# Patient Record
Sex: Male | Born: 2000 | Race: White | Hispanic: No | Marital: Single
Health system: Southern US, Community
[De-identification: ages and names within clinical notes are randomized; demographics above are authoritative.]

## PROBLEM LIST (undated history)

## (undated) DIAGNOSIS — Z22322 Carrier or suspected carrier of Methicillin resistant Staphylococcus aureus: Secondary | ICD-10-CM

---
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
## 2021-06-29 ENCOUNTER — Encounter (HOSPITAL_COMMUNITY): Payer: Self-pay | Admitting: Emergency Medicine

## 2021-06-29 ENCOUNTER — Other Ambulatory Visit: Payer: Self-pay

## 2021-06-29 ENCOUNTER — Emergency Department (HOSPITAL_COMMUNITY): Payer: No Typology Code available for payment source

## 2021-06-29 ENCOUNTER — Emergency Department (HOSPITAL_COMMUNITY)
Admission: EM | Admit: 2021-06-29 | Discharge: 2021-06-29 | Disposition: A | Discharge status: Home / Self Care | Payer: No Typology Code available for payment source | Attending: Emergency Medicine | Admitting: Emergency Medicine

## 2021-06-29 DIAGNOSIS — S0181XA Laceration without foreign body of other part of head, initial encounter: Secondary | ICD-10-CM | POA: Insufficient documentation

## 2021-06-29 DIAGNOSIS — S0990XA Unspecified injury of head, initial encounter: Secondary | ICD-10-CM | POA: Diagnosis present

## 2021-06-29 DIAGNOSIS — Y990 Civilian activity done for income or pay: Secondary | ICD-10-CM | POA: Diagnosis not present

## 2021-06-29 DIAGNOSIS — Y9301 Activity, walking, marching and hiking: Secondary | ICD-10-CM | POA: Diagnosis not present

## 2021-06-29 DIAGNOSIS — S060X1A Concussion with loss of consciousness of 30 minutes or less, initial encounter: Secondary | ICD-10-CM | POA: Diagnosis not present

## 2021-06-29 DIAGNOSIS — W08XXXA Fall from other furniture, initial encounter: Secondary | ICD-10-CM | POA: Insufficient documentation

## 2021-06-29 MED ORDER — LIDOCAINE-EPINEPHRINE (PF) 2 %-1:200000 IJ SOLN
10.0000 mL | Freq: Once | INTRAMUSCULAR | Status: AC
  Administered 2021-06-29: 10 mL
  Filled 2021-06-29: qty 20

## 2021-06-29 NOTE — ED Provider Notes (Signed)
1:13 PM Patient seen in conjunction with [place] PA-C. Patient here after a mechanical fall -- struck head and sustained a laceration to his chin and lower lip.  Reported loss of consciousness.  CT reviewed and is negative.  Patient back at baseline.  He has a gaping wound on his chin that is going to need repaired.  He has braces and has appropriate dental care to get his broken tooth addressed.  Up-to-date on tetanus. No neck pain or focal neuro deficits.   BP 116/67 (BP Location: Right Arm)    Pulse (!) 57    Temp 97.7 F (36.5 C) (Oral)    Resp 16    SpO2 100%   Plan to d/c with follow-up after wound cleaning and repair. Family at bedside and involved.    Renne Crigler, PA-C 06/29/21 1315    Melene Plan, DO 06/29/21 1318

## 2021-06-29 NOTE — ED Notes (Signed)
Patient transported to CT 

## 2021-06-29 NOTE — Discharge Instructions (Addendum)
You were seen today for a concussion and chin laceration.  I have provided patient instructions for concussion. I recommend getting the sutures removed in 5 days. Follow up if you experience loss of consciousness or altered mental status.

## 2021-06-29 NOTE — ED Triage Notes (Signed)
Pt states he tripped over a stool at work and fell around 11:30am.  Laceration to chin and broke L upper tooth.  Co-workers reported +LOC for a few seconds.  Unknown DT.

## 2021-06-29 NOTE — ED Provider Notes (Signed)
[facility] EMERGENCY DEPARTMENT Provider Note   CSN: [id number] Arrival date & time: 06/29/21  1158     History  Chief Complaint  Patient presents with   Lytle Michaels    Gregory Mcknight is a 21 y.o. male.  The patient presents to the emergency department today complaining of loss of consciousness, chin laceration, and broken tooth after a mechanical fall at work.  The patient was walking and tripped over a stool, and believes he fell hitting his face on the floor.  He does not remember hitting the floor but does remember coworkers waking him up.  The patient takes no medications and has no pertinent medical history.  HPI     Home Medications Prior to Admission medications   Medication Sig Start Date End Date Taking? Authorizing Provider  ibuprofen (ADVIL,MOTRIN) 200 MG tablet Take 400 mg by mouth every 8 (eight) hours as needed for headache.    [provider]  ondansetron (ZOFRAN) 4 MG tablet Take 1 tablet (4 mg total) by mouth every 8 (eight) hours as needed for nausea. 04/25/13   Alycia Rossetti, MD  polyethylene glycol powder [facility]) powder Take 8 capfuls of Miralax by mouth with 32-64 ounces of water or gatorade once for constipation clean out.   Following constipation clean out, you may take 1 capful of Miralax by mouth with 8-16 ounces of water or gatorade daily as needed to prevent future episodes of constipation. 10/07/16   Jean Rosenthal, NP  prednisoLONE (PRELONE) 15 MG/5ML SOLN Take 40mg  for 2 days, then 30mg  for 2 days, then 20mg  for 2 days, then 10mg  for 2 days, then 5mg  for 3 days. 10/15/14   Vivi Barrack, MD  white petrolatum (VASELINE) GEL Apply to anal fissure 3-5 times daily for 1 week. Afterwards, you may use the vaseline 3-5 times daily as needed. 10/07/16   Jean Rosenthal, NP      Allergies    Patient has no known allergies.    Review of Systems   Review of Systems  HENT:  Positive for dental problem.   Eyes:  Negative for  photophobia and visual disturbance.  Respiratory:  Negative for shortness of breath.   Cardiovascular:  Negative for chest pain.  Gastrointestinal:  Negative for abdominal pain.  Musculoskeletal:  Negative for neck pain.  Skin:  Positive for wound (Chin laceration).   Physical Exam Updated Vital Signs BP 116/67 (BP Location: Right Arm)    Pulse (!) 57    Temp 97.7 F (36.5 C) (Oral)    Resp 16    SpO2 100%  Physical Exam HENT:     Head: Normocephalic.     Mouth/Throat:   Eyes:     Extraocular Movements: Extraocular movements intact.     Pupils: Pupils are equal, round, and reactive to light.  Cardiovascular:     Rate and Rhythm: Normal rate and regular rhythm.     Pulses: Normal pulses.     Heart sounds: Normal heart sounds.  Pulmonary:     Effort: Pulmonary effort is normal.     Breath sounds: Normal breath sounds.  Musculoskeletal:     Cervical back: Normal range of motion.  Skin:    General: Skin is warm and dry.  Neurological:     General: No focal deficit present.     Mental Status: He is alert.  Psychiatric:        Mood and Affect: Mood normal.    ED Results / Procedures /  Treatments   Labs (all labs ordered are listed, but only abnormal results are displayed) Labs Reviewed - No data to display  EKG None  Radiology CT Head Wo Contrast  Result Date: 06/29/2021 CLINICAL DATA:  Provided history is head trauma and abnormal mental status. EXAM: CT HEAD WITHOUT CONTRAST TECHNIQUE: Contiguous axial images were obtained from the base of the skull through the vertex without intravenous contrast. RADIATION DOSE REDUCTION: This exam was performed according to the departmental dose-optimization program which includes automated exposure control, adjustment of the mA and/or kV according to patient size and/or use of iterative reconstruction technique. COMPARISON:  None. FINDINGS: Brain: No evidence of acute infarction, hemorrhage, hydrocephalus, extra-axial collection or mass  lesion/mass effect. Vascular: No hyperdense vessel or unexpected calcification. Skull: Normal. Negative for fracture or focal lesion. Sinuses/Orbits: Normal globes and orbits. Visualized sinuses are clear. Other: None. IMPRESSION: Normal enhanced CT scan of the brain. Electronically Signed   By: Lajean Manes M.D.   On: 06/29/2021 12:48    Procedures .Marland KitchenLaceration Repair  Date/Time: 06/29/2021 1:49 PM Performed by: Dorothyann Peng, PA Authorized by: Dorothyann Peng, PA   Consent:    Consent obtained:  Verbal   Consent given by:  Patient   Risks, benefits, and alternatives were discussed: yes     Risks discussed:  Pain, infection, retained foreign body and poor cosmetic result   Alternatives discussed:  No treatment Universal protocol:    Procedure explained and questions answered to patient or proxy's satisfaction: yes     Immediately prior to procedure, a time out was called: yes     Patient identity confirmed:  Verbally with patient Anesthesia:    Anesthesia method:  Local infiltration   Local anesthetic:  Lidocaine 2% WITH epi Laceration details:    Location:  Face   Face location:  Chin   Length (cm):  2.5   Depth (mm):  2 Pre-procedure details:    Preparation:  Patient was prepped and draped in usual sterile fashion Exploration:    Hemostasis achieved with:  Direct pressure   Imaging outcome: foreign body not noted     Wound exploration: wound explored through full range of motion     Contaminated: no   Treatment:    Area cleansed with:  Povidone-iodine   Amount of cleaning:  Standard   Irrigation solution:  Sterile saline   Irrigation method:  Syringe   Visualized foreign bodies/material removed: yes     Debridement:  Minimal   Undermining:  None Skin repair:    Repair method:  Sutures   Suture size:  5-0   Suture material:  Prolene   Suture technique:  Simple interrupted Approximation:    Approximation:  Close Repair type:    Repair type:   Simple Post-procedure details:    Dressing:  Open (no dressing)   Procedure completion:  Tolerated    Medications Ordered in ED Medications  lidocaine-EPINEPHrine (XYLOCAINE W/EPI) 2 %-1:200000 (PF) injection 10 mL (has no administration in time range)    ED Course/ Medical Decision Making/ A&P                           Medical Decision Making Amount and/or Complexity of Data Reviewed Radiology: ordered.  Risk Prescription drug management.   This patient presents to the ED for concern of loss of consciousness after a fall , this involves an extensive number of treatment options, and is a complaint that carries with  it a high risk of complications and morbidity.  The differential diagnosis includes but is not exclusive to concussion, carotid artery dissection, skull fractures and more.  The patient also had a dental injury and a chin laceration   Co morbidities that complicate the patient evaluation  None   Additional history obtained:  Additional history obtained from mother   Lab Tests:  None today   Imaging Studies ordered:  I ordered imaging studies including CT head without contrast  I independently visualized and interpreted imaging which showed no acute injury I agree with the radiologist interpretation    Medicines ordered and prescription drug management:  None   Test Considered:  CMP   Critical Interventions:  None   Consultations Obtained:  None    Reevaluation:  After the interventions noted above, I reevaluated the patient and found that they have :improved    Dispostion:  After consideration of the diagnostic results and the patients response to treatment, I feel that the patent would benefit from discharge home with outpatient follow up. The patient's mother is scheduling appointments to address the dental injury. The patient's CT scan was negative for acute processes. His symptoms are consistent with concussion. Information  provided for concussion clinic. Laceration repair completed. Return precautions given     Final Clinical Impression(s) / ED Diagnoses Final diagnoses:  Concussion with loss of consciousness of 30 minutes or less, initial encounter  Laceration of skin of chin, initial encounter    Rx / DC Orders ED Discharge Orders     None         Dorothyann Peng, [place] 06/29/21 [place], DO 06/29/21 1421

## 2022-06-01 IMAGING — CT CT HEAD W/O CM
4 series · 16 of 47 positions shown, 18 images · non-contrast
Comparison: None.

CLINICAL DATA: Provided history is head trauma and abnormal mental
status.



[Series 3: head without · axial · non-contrast · 0.43mm/px · z∈[+26,+151]mm · 7 of 35 slices shown, 9 images]
[im 5/35  brain]
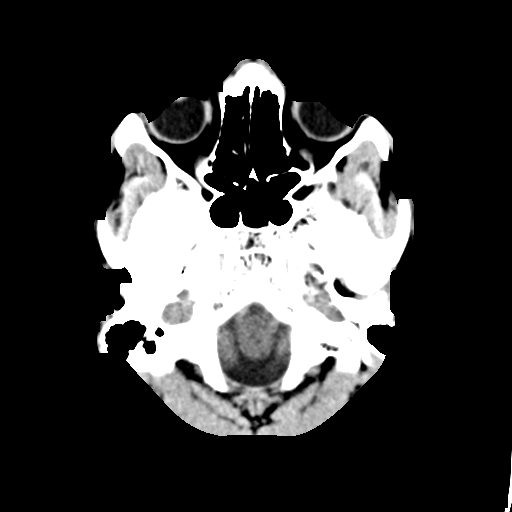
[im 5/35  bone]
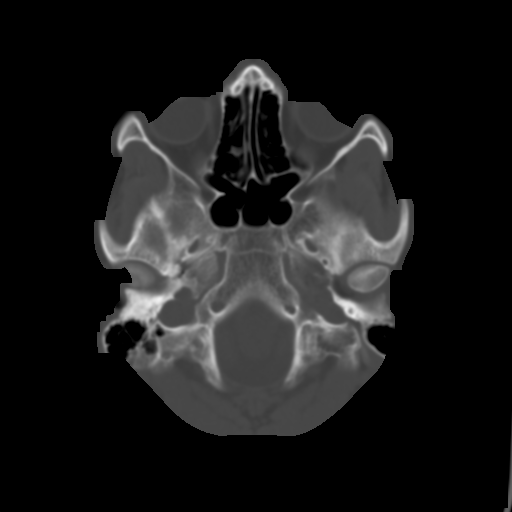
[im 9/35  brain]
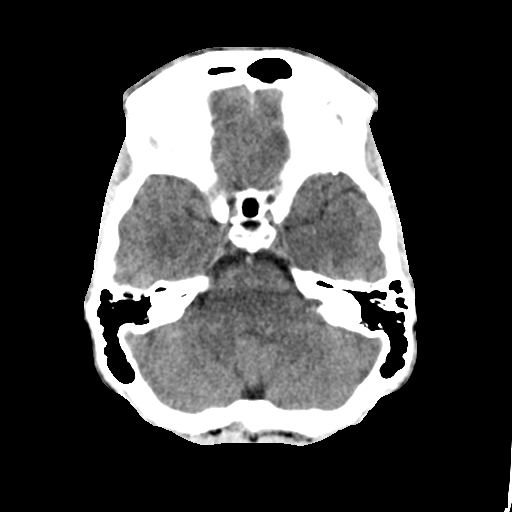
[im 13/35  brain]
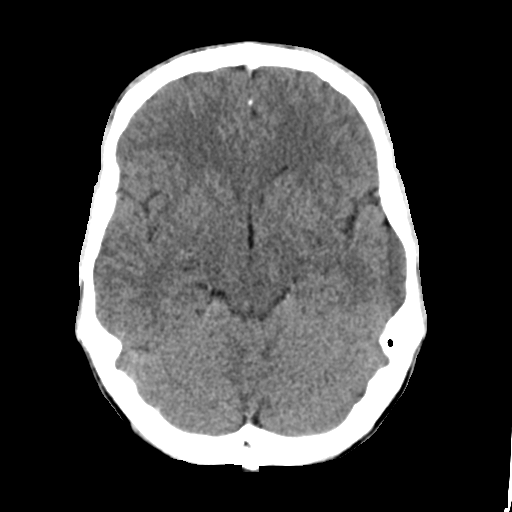
[im 18/35  brain]
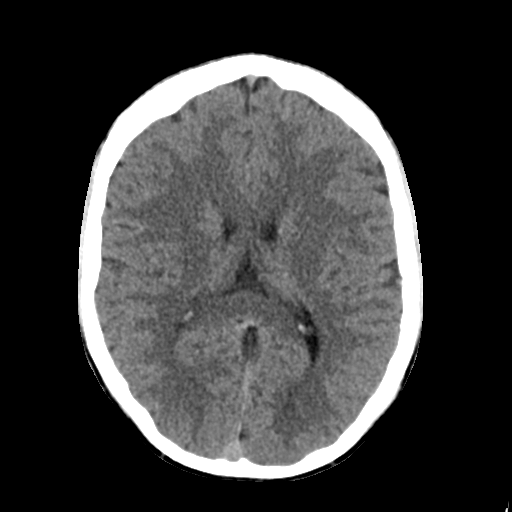
[im 22/35  brain]
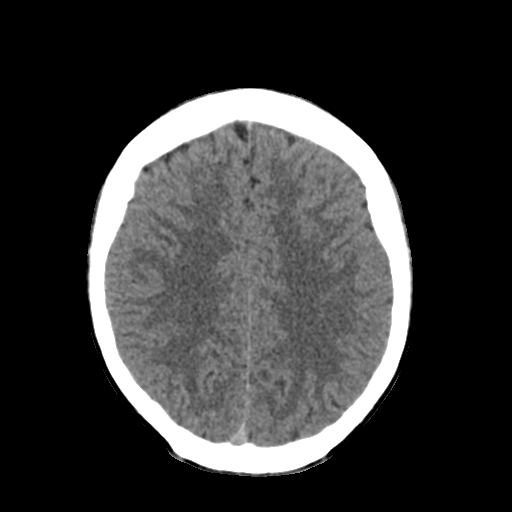
[im 22/35  bone]
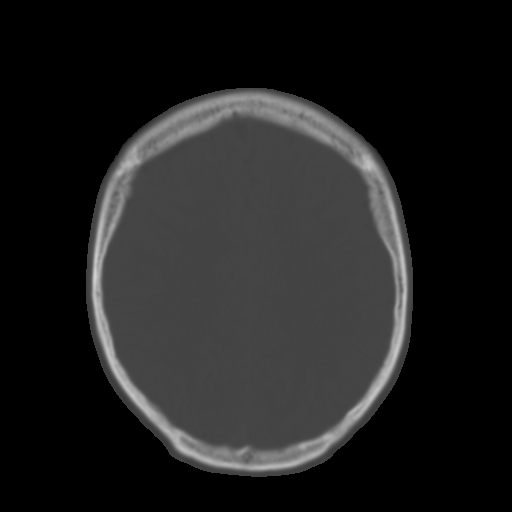
[im 26/35  brain]
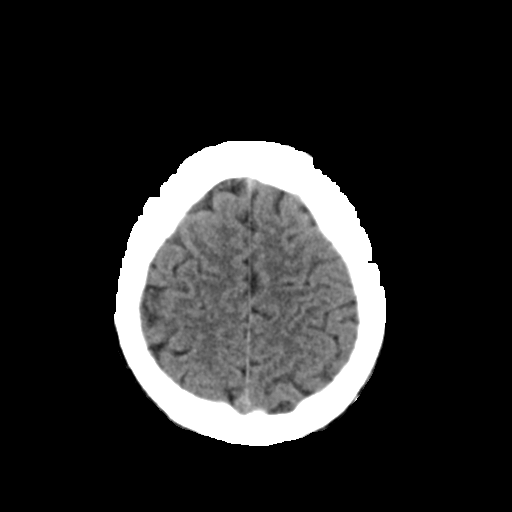
[im 30/35  brain]
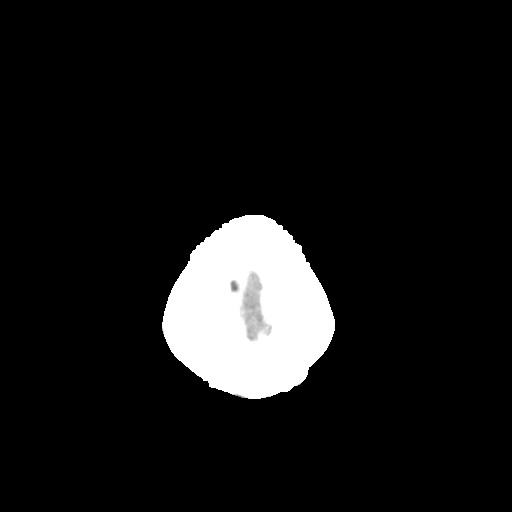

[Series 4: ax head bone · axial · 0.43mm/px · z∈[+24,+58]mm · 3 of 86 slices shown]
[im 9/86  bone]
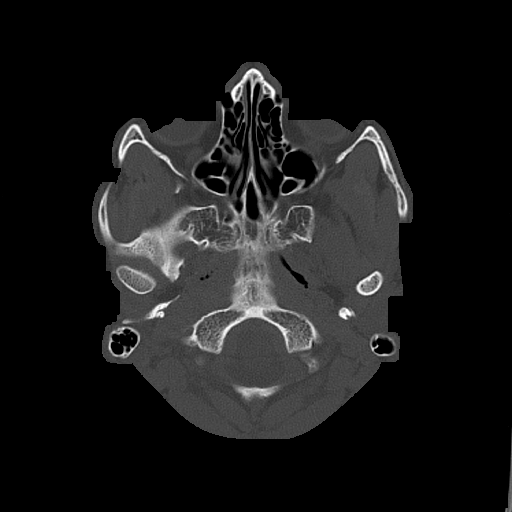
[im 18/86  bone]
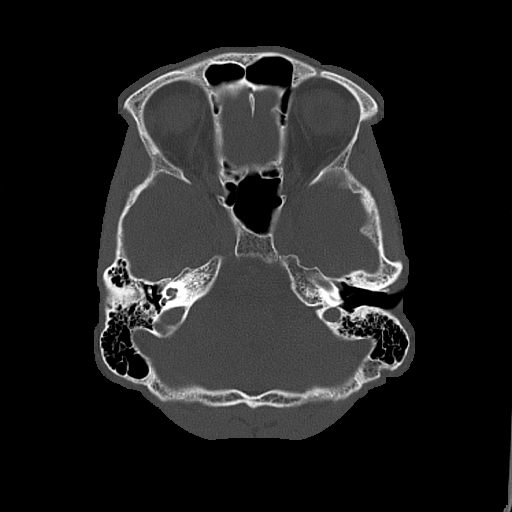
[im 26/86  bone]
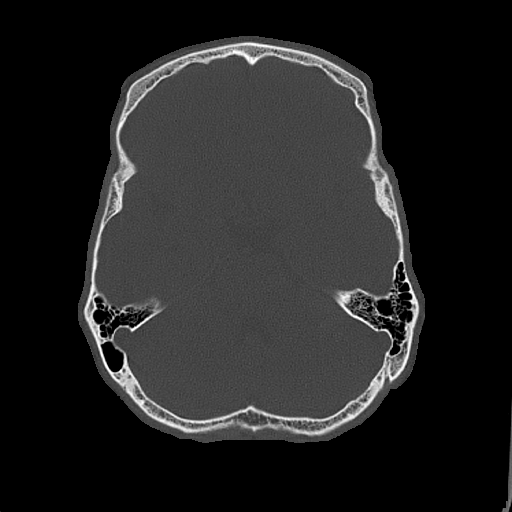

[Series 5: head without cor · coronal · non-contrast · 0.33mm/px · 3 of 66 slices shown]
[im 22/66  brain]
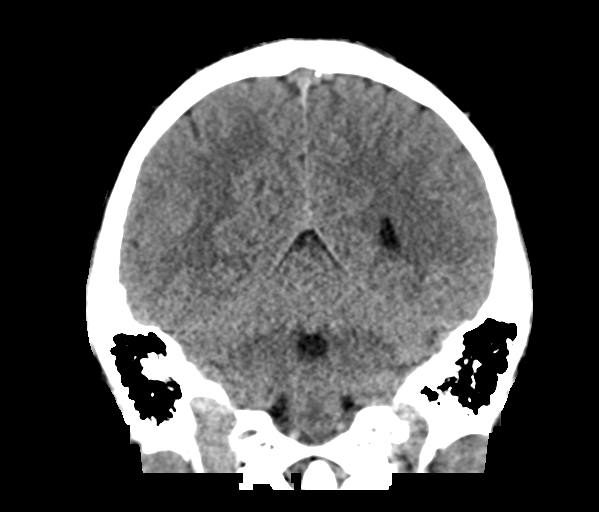
[im 29/66  brain]
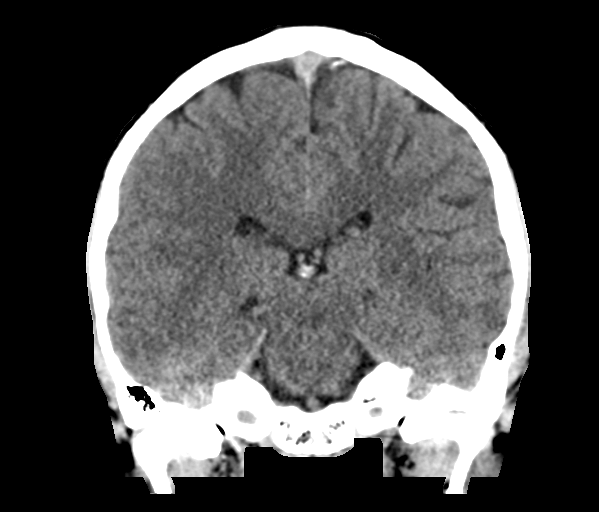
[im 37/66  brain]
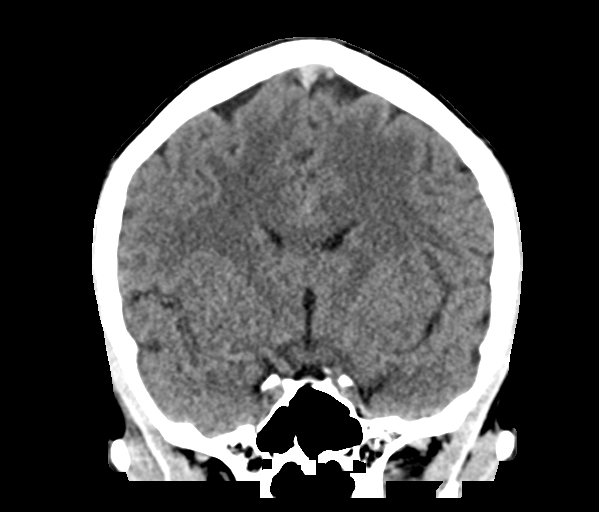

[Series 6: head without sag · sagittal · non-contrast · 0.32mm/px · 3 of 54 slices shown]
[im 18/54  brain]
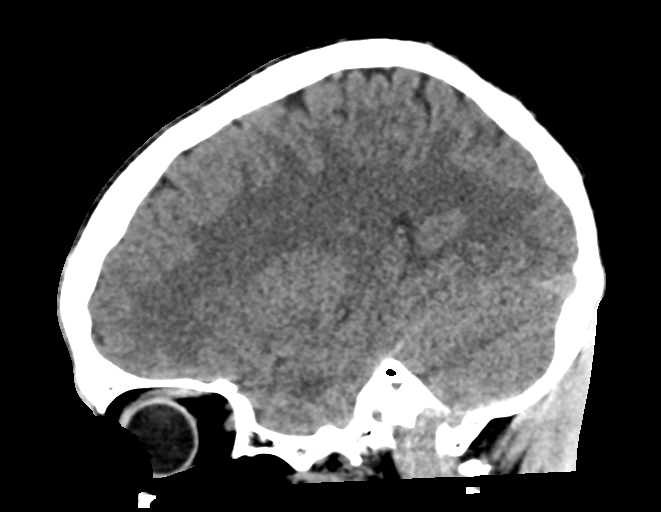
[im 27/54  brain]
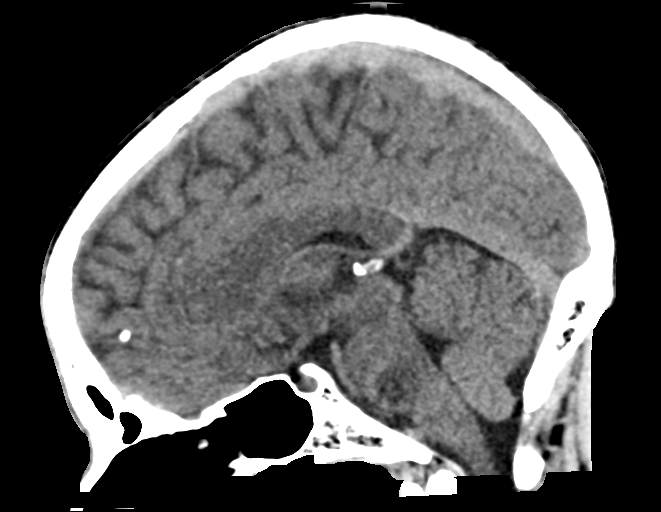
[im 36/54  brain]
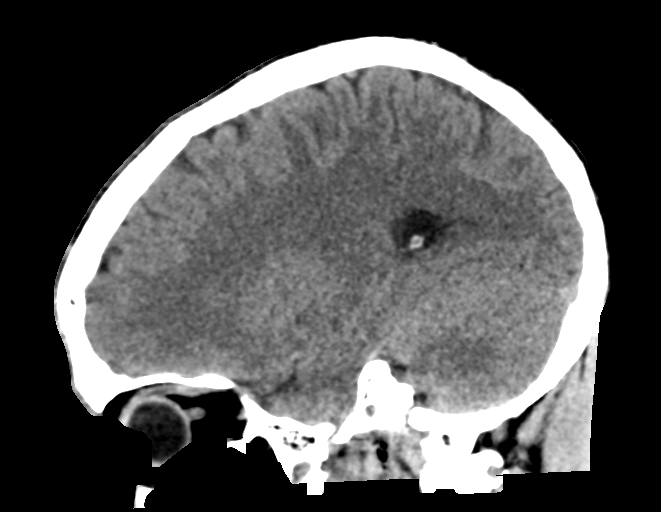

[16 of 47 positions shown; findings below may reference images not displayed]

FINDINGS: Brain: No evidence of acute infarction, hemorrhage, hydrocephalus,
extra-axial collection or mass lesion/mass effect.

Vascular: No hyperdense vessel or unexpected calcification.

Skull: Normal. Negative for fracture or focal lesion.

Sinuses/Orbits: Normal globes and orbits. Visualized sinuses are
clear.

Other: None.
IMPRESSION: Normal enhanced CT scan of the brain.

## 2023-01-18 ENCOUNTER — Emergency Department (HOSPITAL_COMMUNITY)
Admission: EM | Admit: 2023-01-18 | Discharge: 2023-01-18 | Disposition: A | Discharge status: Home / Self Care | Payer: Worker's Compensation | Source: Home / Self Care | Attending: Emergency Medicine | Admitting: Emergency Medicine

## 2023-01-18 ENCOUNTER — Other Ambulatory Visit: Payer: Self-pay

## 2023-01-18 ENCOUNTER — Encounter (HOSPITAL_COMMUNITY): Payer: Self-pay

## 2023-01-18 DIAGNOSIS — S41111A Laceration without foreign body of right upper arm, initial encounter: Secondary | ICD-10-CM | POA: Insufficient documentation

## 2023-01-18 DIAGNOSIS — W298XXA Contact with other powered powered hand tools and household machinery, initial encounter: Secondary | ICD-10-CM | POA: Diagnosis not present

## 2023-01-18 MED ORDER — LIDOCAINE-EPINEPHRINE (PF) 2 %-1:200000 IJ SOLN
5.0000 mL | Freq: Once | INTRAMUSCULAR | Status: AC
  Administered 2023-01-18: 5 mL via INTRADERMAL
  Filled 2023-01-18: qty 20

## 2023-01-18 MED ORDER — LIDOCAINE-EPINEPHRINE 2 %-1:100000 IJ SOLN: 30.0000 mL | Freq: Once | INTRAMUSCULAR | Status: DC

## 2023-01-18 NOTE — ED Notes (Signed)
Suture cart placed in Hallway 20

## 2023-01-18 NOTE — Discharge Instructions (Addendum)
We placed 3 stitches to the cut on your arm. Have these removed in 7-10 days. Keep area clean, dry, and bandaged. For any redness, pus like drainage, fever, or concerns for infection, return to ED for re-evaluation.

## 2023-01-18 NOTE — ED Notes (Addendum)
Pts family requested help, concerned because right arm was turning blue and cold to touch. PMS assess pt has a cap refill of +2,  radial pulse is strong and present. Pt confirms sensation and movement. Will continue to monitor

## 2023-01-18 NOTE — ED Provider Notes (Signed)
[place] EMERGENCY DEPARTMENT AT [facility] Provider Note   CSN: [id number] Arrival date & time: 01/18/23  1503     History  Chief Complaint  Patient presents with   Rt arm lac    Gregory Mcknight is a 22 y.o. male who presents to the ED complaining of laceration to his right arm.  States that he was using pliers when he accidentally caused a gash in the right upper arm.  States that the pliers were intact and he is able to move the extremity well.  No other injury.  Last tetanus this year.  No significant pain.  Does not take anticoagulants.      Home Medications Prior to Admission medications   Medication Sig Start Date End Date Taking? Authorizing Provider  ibuprofen (ADVIL,MOTRIN) 200 MG tablet Take 400 mg by mouth every 8 (eight) hours as needed for headache.    [provider]  ondansetron (ZOFRAN) 4 MG tablet Take 1 tablet (4 mg total) by mouth every 8 (eight) hours as needed for nausea. 04/25/13   Salley Scarlet, MD  polyethylene glycol powder [facility]) powder Take 8 capfuls of Miralax by mouth with 32-64 ounces of water or gatorade once for constipation clean out.   Following constipation clean out, you may take 1 capful of Miralax by mouth with 8-16 ounces of water or gatorade daily as needed to prevent future episodes of constipation. 10/07/16   Sherrilee Gilles, NP  prednisoLONE (PRELONE) 15 MG/5ML SOLN Take 40mg  for 2 days, then 30mg  for 2 days, then 20mg  for 2 days, then 10mg  for 2 days, then 5mg  for 3 days. 10/15/14   Ardith Dark, MD  white petrolatum (VASELINE) GEL Apply to anal fissure 3-5 times daily for 1 week. Afterwards, you may use the vaseline 3-5 times daily as needed. 10/07/16   Sherrilee Gilles, NP      Allergies    Patient has no known allergies.    Review of Systems   Review of Systems  All other systems reviewed and are negative.   Physical Exam Updated Vital Signs BP 136/72 (BP Location: Left Arm)   Pulse 67    Temp (!) 97.2 F (36.2 C) (Oral)   Resp 20   Ht 6' (1.829 m)   Wt 54.4 kg   SpO2 100%   BMI 16.27 kg/m  Physical Exam Vitals and nursing note reviewed.  Constitutional:      General: He is not in acute distress.    Appearance: Normal appearance.  HENT:     Head: Normocephalic and atraumatic.     Mouth/Throat:     Mouth: Mucous membranes are moist.  Eyes:     Conjunctiva/sclera: Conjunctivae normal.  Cardiovascular:     Rate and Rhythm: Normal rate and regular rhythm.  Pulmonary:     Effort: Pulmonary effort is normal.     Breath sounds: Normal breath sounds.  Abdominal:     General: Abdomen is flat.     Palpations: Abdomen is soft.  Musculoskeletal:        General: Normal range of motion.     Cervical back: Neck supple.     Comments: 2 cm slightly irregular laceration that is gaping to the right distal humerus, minimal active bleeding, range of motion intact, neurovascularly intact distally  Skin:    General: Skin is warm and dry.     Capillary Refill: Capillary refill takes less than 2 seconds.  Neurological:     Mental  Status: He is alert. Mental status is at baseline.  Psychiatric:        Behavior: Behavior normal.     ED Results / Procedures / Treatments   Labs (all labs ordered are listed, but only abnormal results are displayed) Labs Reviewed - No data to display  EKG None  Radiology No results found.  Procedures .Marland KitchenLaceration Repair  Date/Time: 01/18/2023 10:20 PM  Performed by: Tonette Lederer, PA-C Authorized by: Tonette Lederer, PA-C   Consent:    Consent obtained:  Verbal   Consent given by:  Patient   Risks, benefits, and alternatives were discussed: yes     Risks discussed:  Infection, pain, retained foreign body, poor cosmetic result, need for additional repair and poor wound healing   Alternatives discussed:  No treatment, delayed treatment, observation and referral Universal protocol:    Procedure explained and questions answered to  patient or proxy's satisfaction: yes     Immediately prior to procedure, a time out was called: yes     Patient identity confirmed:  Verbally with patient Anesthesia:    Anesthesia method:  Local infiltration   Local anesthetic:  Lidocaine 1% WITH epi Laceration details:    Location: right arm.   Length (cm):  2 Pre-procedure details:    Preparation:  Patient was prepped and draped in usual sterile fashion Exploration:    Hemostasis achieved with:  Epinephrine   Imaging outcome: foreign body not noted     Wound exploration: wound explored through full range of motion and entire depth of wound visualized   Treatment:    Area cleansed with:  Povidone-iodine   Amount of cleaning:  Extensive   Irrigation solution:  Sterile water   Irrigation method:  Syringe   Visualized foreign bodies/material removed: no   Skin repair:    Repair method:  Sutures   Suture size:  4-0   Suture material:  Nylon   Suture technique:  Simple interrupted   Number of sutures:  3 Approximation:    Approximation:  Close Repair type:    Repair type:  Simple Post-procedure details:    Dressing:  Non-adherent dressing   Procedure completion:  Tolerated Comments:     During laceration repair, pt began to feel lightheaded and dizzy.  He reported that he had not eaten all day.  Started to become tremulous.  Was able to successfully complete laceration repair but did have to pause with tremors making it slightly difficult to place sutures. Provided with apple sauce and pt's tremors resolved. He remained alert and oriented. He stated that this happens occasionally as he does not eat regularly. Overall, achieved good hemostasis and approximation with sutures as above and pt provided with wound care instructions.     Medications Ordered in ED Medications  lidocaine-EPINEPHrine (XYLOCAINE W/EPI) 2 %-1:200000 (PF) injection 5 mL (5 mLs Intradermal Given 01/18/23 2225)    ED Course/ Medical Decision Making/ A&P                                  Medical Decision Making Risk Prescription drug management.   Medical Decision Making:   Gregory Mcknight is a 22 y.o. male who presented to the ED today with laceration detailed above.    Patient's presentation is complicated by their history of trauma.  Complete initial physical exam performed, notably the patient  was in no acute distress.  He had a gaping laceration  as above to the right upper extremity with bleeding relatively controlled.  Neurovascularly intact distally.  Full range of motion.  Entire depth of wound able to be visualized and without foreign body.    Reviewed and confirmed nursing documentation for past medical history, family history, social history.    Initial Assessment:   With the patient's presentation of laceration, differential diagnosis includes but is not limited to simple vs intermediate vs complex laceration, tendon injury, nerve injury, retained foreign body, fracture, dislocation, wound infection, skin tear, abrasion.  This is most consistent with an acute complicated illness  Initial Plan:  Tdap booster up to date and/or given during today's visit Extensive irrigation performed  Pain management as needed Laceration repair as indicated Objective evaluation as reviewed    Final Assessment and Plan:   Laceration occurred < 8 hours prior to repair which was well tolerated. Pt has no co morbidities to affect normal wound healing. Discussed suture home care with pt and answered questions. Pt to follow up for wound check and suture removal in 7 days or earlier for any concerns. Pt is hemodynamically stable with no complaints prior to discharge.  Did have brief episode as above.  Normal mentation, provided with PO nutrition with resolution of symptoms. Strict ED return precautions given for wound complications, infection, recurrent injury, etc.     Clinical Impression:  1. Arm laceration, right, initial encounter      Discharge            Final Clinical Impression(s) / ED Diagnoses Final diagnoses:  Arm laceration, right, initial encounter    Rx / DC Orders ED Discharge Orders     None         Tonette Lederer, PA-C 01/18/23 2300    Glyn Ade, MD 01/19/23 1457

## 2023-01-18 NOTE — ED Triage Notes (Signed)
Pt came in via POV d/t a lac above her Rt AC d/t pliers snapping in two while changing a battery on machinery. A/Ox4, rates pain 5/10 while in triage.
